# Patient Record
Sex: Male | Born: 1965 | Race: White | Hispanic: No | Marital: Single | Smoking: Current every day smoker
Health system: Southern US, Community
[De-identification: ages and names within clinical notes are randomized; demographics above are authoritative.]

## PROBLEM LIST (undated history)

## (undated) DIAGNOSIS — I10 Essential (primary) hypertension: Secondary | ICD-10-CM

## (undated) DIAGNOSIS — F329 Major depressive disorder, single episode, unspecified: Secondary | ICD-10-CM

## (undated) DIAGNOSIS — N289 Disorder of kidney and ureter, unspecified: Secondary | ICD-10-CM

## (undated) DIAGNOSIS — F32A Depression, unspecified: Secondary | ICD-10-CM

---

## 2017-01-04 ENCOUNTER — Emergency Department (HOSPITAL_COMMUNITY)
Admission: EM | Admit: 2017-01-04 | Discharge: 2017-01-04 | Disposition: A | Discharge status: Home / Self Care | Payer: Self-pay | Attending: Emergency Medicine | Admitting: Emergency Medicine

## 2017-01-04 ENCOUNTER — Other Ambulatory Visit: Payer: Self-pay

## 2017-01-04 ENCOUNTER — Encounter (HOSPITAL_COMMUNITY): Payer: Self-pay | Admitting: *Deleted

## 2017-01-04 DIAGNOSIS — Z5321 Procedure and treatment not carried out due to patient leaving prior to being seen by health care provider: Secondary | ICD-10-CM | POA: Insufficient documentation

## 2017-01-04 DIAGNOSIS — R443 Hallucinations, unspecified: Secondary | ICD-10-CM | POA: Insufficient documentation

## 2017-01-04 HISTORY — DX: Essential (primary) hypertension: I10

## 2017-01-04 HISTORY — DX: Depression, unspecified: F32.A

## 2017-01-04 HISTORY — DX: Major depressive disorder, single episode, unspecified: F32.9

## 2017-01-04 HISTORY — DX: Disorder of kidney and ureter, unspecified: N28.9

## 2017-01-04 NOTE — ED Notes (Signed)
Pt called for room with no response.  °

## 2017-01-04 NOTE — ED Notes (Signed)
Patient called for room with no response.  

## 2017-01-04 NOTE — ED Notes (Signed)
Pt called for room, no response from lobby 

## 2017-01-04 NOTE — ED Triage Notes (Addendum)
Pt has been under a lot of stress the last year, mother died, homeless, htn etc. Today started to feel worse and decided to come in. Pt keeps stating he needs to be admitted he feels so bad. Unable to define feeling bad.

## 2017-01-05 ENCOUNTER — Emergency Department (HOSPITAL_COMMUNITY): Payer: Self-pay

## 2017-01-05 ENCOUNTER — Emergency Department (HOSPITAL_COMMUNITY)
Admission: EM | Admit: 2017-01-05 | Discharge: 2017-01-06 | Disposition: A | Discharge status: Home / Self Care | Payer: Self-pay | Attending: Emergency Medicine | Admitting: Emergency Medicine

## 2017-01-05 ENCOUNTER — Other Ambulatory Visit: Payer: Self-pay

## 2017-01-05 ENCOUNTER — Encounter (HOSPITAL_COMMUNITY): Payer: Self-pay | Admitting: Emergency Medicine

## 2017-01-05 DIAGNOSIS — I10 Essential (primary) hypertension: Secondary | ICD-10-CM | POA: Insufficient documentation

## 2017-01-05 DIAGNOSIS — Z5900 Homelessness unspecified: Secondary | ICD-10-CM

## 2017-01-05 DIAGNOSIS — F17200 Nicotine dependence, unspecified, uncomplicated: Secondary | ICD-10-CM | POA: Insufficient documentation

## 2017-01-05 DIAGNOSIS — F29 Unspecified psychosis not due to a substance or known physiological condition: Secondary | ICD-10-CM | POA: Insufficient documentation

## 2017-01-05 DIAGNOSIS — R51 Headache: Secondary | ICD-10-CM | POA: Insufficient documentation

## 2017-01-05 DIAGNOSIS — E860 Dehydration: Secondary | ICD-10-CM | POA: Insufficient documentation

## 2017-01-05 LAB — COMPREHENSIVE METABOLIC PANEL
ALBUMIN: 4.3 g/dL (ref 3.5–5.0)
ALK PHOS: 60 U/L (ref 38–126)
ALK PHOS: 48 U/L (ref 38–126)
ALT: 48 U/L (ref 17–63)
ALT: 38 U/L (ref 17–63)
ANION GAP: 12 (ref 5–15)
ANION GAP: 8 (ref 5–15)
AST: 129 U/L — ABNORMAL HIGH (ref 15–41)
AST: 99 U/L — ABNORMAL HIGH (ref 15–41)
Albumin: 3.5 g/dL (ref 3.5–5.0)
BILIRUBIN TOTAL: 3.1 mg/dL — AB (ref 0.3–1.2)
BILIRUBIN TOTAL: 2.3 mg/dL — AB (ref 0.3–1.2)
BUN: 28 mg/dL — ABNORMAL HIGH (ref 6–20)
BUN: 24 mg/dL — ABNORMAL HIGH (ref 6–20)
CALCIUM: 9 mg/dL (ref 8.9–10.3)
CALCIUM: 8.3 mg/dL — AB (ref 8.9–10.3)
CO2: 22 mmol/L (ref 22–32)
CO2: 24 mmol/L (ref 22–32)
Chloride: 93 mmol/L — ABNORMAL LOW (ref 101–111)
Chloride: 100 mmol/L — ABNORMAL LOW (ref 101–111)
Creatinine, Ser: 1.37 mg/dL — ABNORMAL HIGH (ref 0.61–1.24)
Creatinine, Ser: 1.3 mg/dL — ABNORMAL HIGH (ref 0.61–1.24)
GFR calc non Af Amer: 58 mL/min — ABNORMAL LOW (ref >60)
GFR calc non Af Amer: >60 mL/min (ref >60)
GLUCOSE: 167 mg/dL — AB (ref 65–99)
Glucose, Bld: 87 mg/dL (ref 65–99)
POTASSIUM: 5.3 mmol/L — AB (ref 3.5–5.1)
POTASSIUM: 4.2 mmol/L (ref 3.5–5.1)
SODIUM: 132 mmol/L — AB (ref 135–145)
Sodium: 127 mmol/L — ABNORMAL LOW (ref 135–145)
TOTAL PROTEIN: 7.6 g/dL (ref 6.5–8.1)
TOTAL PROTEIN: 6.2 g/dL — AB (ref 6.5–8.1)

## 2017-01-05 LAB — URINALYSIS, ROUTINE W REFLEX MICROSCOPIC
BILIRUBIN URINE: NEGATIVE
Bacteria, UA: NONE SEEN
GLUCOSE, UA: NEGATIVE mg/dL
KETONES UR: NEGATIVE mg/dL
LEUKOCYTES UA: NEGATIVE
NITRITE: NEGATIVE
PH: 6 (ref 5.0–8.0)
Protein, ur: NEGATIVE mg/dL
Specific Gravity, Urine: 1.011 (ref 1.005–1.030)
Squamous Epithelial / LPF: NONE SEEN
WBC, UA: NONE SEEN WBC/hpf (ref 0–5)

## 2017-01-05 LAB — RAPID URINE DRUG SCREEN, HOSP PERFORMED
AMPHETAMINES: NOT DETECTED
BENZODIAZEPINES: NOT DETECTED
Barbiturates: NOT DETECTED
COCAINE: NOT DETECTED
OPIATES: NOT DETECTED
TETRAHYDROCANNABINOL: NOT DETECTED

## 2017-01-05 LAB — CBC
HCT: 45 % (ref 39.0–52.0)
HCT: 38.4 % — ABNORMAL LOW (ref 39.0–52.0)
HEMOGLOBIN: 15.8 g/dL (ref 13.0–17.0)
Hemoglobin: 13.5 g/dL (ref 13.0–17.0)
MCH: 34.1 pg — ABNORMAL HIGH (ref 26.0–34.0)
MCH: 34 pg (ref 26.0–34.0)
MCHC: 35.1 g/dL (ref 30.0–36.0)
MCHC: 35.2 g/dL (ref 30.0–36.0)
MCV: 97.2 fL (ref 78.0–100.0)
MCV: 96.7 fL (ref 78.0–100.0)
PLATELETS: 213 10*3/uL (ref 150–400)
PLATELETS: 175 10*3/uL (ref 150–400)
RBC: 4.63 MIL/uL (ref 4.22–5.81)
RBC: 3.97 MIL/uL — ABNORMAL LOW (ref 4.22–5.81)
RDW: 13.1 % (ref 11.5–15.5)
RDW: 13.1 % (ref 11.5–15.5)
WBC: 18.6 10*3/uL — AB (ref 4.0–10.5)
WBC: 12.2 10*3/uL — ABNORMAL HIGH (ref 4.0–10.5)

## 2017-01-05 LAB — BASIC METABOLIC PANEL
Anion gap: 5 (ref 5–15)
BUN: 23 mg/dL — AB (ref 6–20)
CALCIUM: 8 mg/dL — AB (ref 8.9–10.3)
CO2: 25 mmol/L (ref 22–32)
CREATININE: 1.26 mg/dL — AB (ref 0.61–1.24)
Chloride: 102 mmol/L (ref 101–111)
GFR calc Af Amer: >60 mL/min (ref >60)
GFR calc non Af Amer: >60 mL/min (ref >60)
GLUCOSE: 106 mg/dL — AB (ref 65–99)
Potassium: 3.7 mmol/L (ref 3.5–5.1)
Sodium: 132 mmol/L — ABNORMAL LOW (ref 135–145)

## 2017-01-05 LAB — CBC WITH DIFFERENTIAL/PLATELET
BASOS PCT: 0 %
Basophils Absolute: 0 10*3/uL (ref 0.0–0.1)
EOS ABS: 0.1 10*3/uL (ref 0.0–0.7)
EOS PCT: 0 %
HCT: 40.8 % (ref 39.0–52.0)
HEMOGLOBIN: 14.4 g/dL (ref 13.0–17.0)
LYMPHS ABS: 2.7 10*3/uL (ref 0.7–4.0)
Lymphocytes Relative: 18 %
MCH: 33.8 pg (ref 26.0–34.0)
MCHC: 35.3 g/dL (ref 30.0–36.0)
MCV: 95.8 fL (ref 78.0–100.0)
MONO ABS: 1.6 10*3/uL — AB (ref 0.1–1.0)
MONOS PCT: 11 %
NEUTROS PCT: 71 %
Neutro Abs: 10.6 10*3/uL — ABNORMAL HIGH (ref 1.7–7.7)
Platelets: 186 10*3/uL (ref 150–400)
RBC: 4.26 MIL/uL (ref 4.22–5.81)
RDW: 13.1 % (ref 11.5–15.5)
WBC: 14.9 10*3/uL — ABNORMAL HIGH (ref 4.0–10.5)

## 2017-01-05 LAB — INFLUENZA PANEL BY PCR (TYPE A & B)
INFLAPCR: NEGATIVE
Influenza B By PCR: NEGATIVE

## 2017-01-05 LAB — I-STAT CG4 LACTIC ACID, ED: Lactic Acid, Venous: 0.75 mmol/L (ref 0.5–1.9)

## 2017-01-05 LAB — AMMONIA: Ammonia: 39 umol/L — ABNORMAL HIGH (ref 9–35)

## 2017-01-05 LAB — ETHANOL

## 2017-01-05 MED ORDER — ZIPRASIDONE MESYLATE 20 MG IM SOLR: 20.0000 mg | Freq: Two times a day (BID) | INTRAMUSCULAR | Status: DC | PRN

## 2017-01-05 MED ORDER — ZOLPIDEM TARTRATE 5 MG PO TABS: 5.0000 mg | ORAL_TABLET | Freq: Every evening | ORAL | Status: DC | PRN

## 2017-01-05 MED ORDER — ALUM & MAG HYDROXIDE-SIMETH 200-200-20 MG/5ML PO SUSP
30.0000 mL | Freq: Four times a day (QID) | ORAL | Status: DC | PRN
Start: 2017-01-05 — End: 2017-01-06

## 2017-01-05 MED ORDER — SODIUM CHLORIDE 0.9 % IV BOLUS (SEPSIS)
1000.0000 mL | Freq: Once | INTRAVENOUS | Status: AC
  Administered 2017-01-05: 1000 mL via INTRAVENOUS

## 2017-01-05 MED ORDER — NICOTINE 21 MG/24HR TD PT24
21.0000 mg | MEDICATED_PATCH | Freq: Every day | TRANSDERMAL | Status: DC
  Administered 2017-01-05: 21 mg via TRANSDERMAL
  Filled 2017-01-05: qty 1

## 2017-01-05 MED ORDER — ACETAMINOPHEN 325 MG PO TABS
650.0000 mg | ORAL_TABLET | ORAL | Status: DC | PRN
  Administered 2017-01-05: 650 mg via ORAL
  Filled 2017-01-05: qty 2

## 2017-01-05 MED ORDER — LORAZEPAM 2 MG/ML IJ SOLN
1.0000 mg | Freq: Once | INTRAMUSCULAR | Status: AC
  Administered 2017-01-05: 1 mg via INTRAVENOUS
  Filled 2017-01-05: qty 1

## 2017-01-05 MED ORDER — LORAZEPAM 1 MG PO TABS
1.0000 mg | ORAL_TABLET | Freq: Once | ORAL | Status: AC
  Administered 2017-01-05: 1 mg via ORAL
  Filled 2017-01-05: qty 1

## 2017-01-05 MED ORDER — SODIUM CHLORIDE 0.9 % IV SOLN
1000.0000 mL | INTRAVENOUS | Status: DC
  Administered 2017-01-05: 1000 mL via INTRAVENOUS

## 2017-01-05 MED ORDER — LORAZEPAM 1 MG PO TABS: 1.0000 mg | ORAL_TABLET | ORAL | Status: DC | PRN

## 2017-01-05 NOTE — ED Triage Notes (Signed)
Pt c/o headache and feeling like "lasers are running through my head". Pt confused, difficulty following instructions, pt states noises are bothering him, pt with visual hallucinations. Pt denies SI.

## 2017-01-05 NOTE — ED Notes (Addendum)
With assessment pt verbalizes splitting headache, off balance, and hearing voices for a week. Pt alert to self, situation, and place not date/year. Pt denies drug or alcohol use. Effie ShyWentz MD aware of such findings; additional orders placed per verbal.

## 2017-01-05 NOTE — ED Notes (Signed)
Pt actively speaking to staff about the devil, the Lord's five crowns, and being stuck to the bed by magnets. Pt did not speak or present as such with previous assessment. Pt change in behavior post MD assessment.
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
## 2017-01-05 NOTE — ED Provider Notes (Addendum)
[facility]-EMERGENCY DEPT Provider Note   CSN: [id number] Arrival date & time: 01/05/17  1239     History   Chief Complaint Chief Complaint  Patient presents with  . hallucinations  . Headache    HPI Tanner Li is a 51 y.o. male.  Patient is here, complaining of "splitting headache" which gets worse when he gets into a bathtub.  He is homeless and does not have a bathtub.  He reports that he was admitted [facility] recently, for evaluation and treatment of a headache.  [place]  He denies use of illegal drugs.  He denies recent illnesses including fever, cough, vomiting, weakness or dizziness.  There are no other known modifying factors.  HPI  Past Medical History:  Diagnosis Date  . Depression   . Hypertension   . Renal disorder     There are no active problems to display for this patient.   History reviewed. No pertinent surgical history.     Home Medications    Prior to Admission medications   Not on File    Family History History reviewed. No pertinent family history.  Social History Social History   Tobacco Use  . Smoking status: Current Every Day Smoker  . Smokeless tobacco: Never Used  Substance Use Topics  . Alcohol use: Yes  . Drug use: No     Allergies   Penicillins   Review of Systems Review of Systems  All other systems reviewed and are negative.    Physical Exam Updated Vital Signs BP 135/81 (BP Location: Right Arm)   Pulse (!) 103   Temp 98.7 F (37.1 C) (Oral)   Resp 20   SpO2 99%   Physical Exam  Constitutional: He is oriented to person, place, and time. He appears well-developed and well-nourished.  HENT:  Head: Normocephalic and atraumatic.  Right Ear: External ear normal.  Left Ear: External ear normal.  Eyes: Conjunctivae and EOM are normal. Pupils are equal, round, and reactive to light.  Neck: Normal range of motion and phonation  normal. Neck supple.  Cardiovascular: Normal rate, regular rhythm and normal heart sounds.  Pulmonary/Chest: Effort normal and breath sounds normal. He exhibits no bony tenderness.  Abdominal: Soft. There is no tenderness.  Musculoskeletal: Normal range of motion.  Neurological: He is alert and oriented to person, place, and time. No cranial nerve deficit or sensory deficit. He exhibits normal muscle tone. Coordination normal.  Skin: Skin is warm, dry and intact.  Psychiatric: Judgment and thought content normal. His mood appears anxious. He is agitated.  He is fairly cooperative, but appears to be responding to internal stimuli.  He holds his arms in peculiar positions, but will move them when asked to do that.  Nursing note and vitals reviewed.    ED Treatments / Results  Labs (all labs ordered are listed, but only abnormal results are displayed) Labs Reviewed  COMPREHENSIVE METABOLIC PANEL - Abnormal; Notable for the following components:      Result Value   Sodium 127 (*)    Potassium 5.3 (*)    Chloride 93 (*)    Glucose, Bld 167 (*)    BUN 28 (*)    Creatinine, Ser 1.37 (*)    AST 129 (*)    Total Bilirubin 3.1 (*)    GFR calc non Af Amer 58 (*)    All other components within normal limits  CBC - Abnormal; Notable for  the following components:   WBC 18.6 (*)    MCH 34.1 (*)    All other components within normal limits  ETHANOL  RAPID URINE DRUG SCREEN, HOSP PERFORMED    EKG  EKG Interpretation None       Radiology Ct Head Wo Contrast  Result Date: 01/05/2017 CLINICAL DATA:  Altered mental status.  No reported injury. EXAM: CT HEAD WITHOUT CONTRAST TECHNIQUE: Contiguous axial images were obtained from the base of the skull through the vertex without intravenous contrast. COMPARISON:  None. FINDINGS: Brain: No evidence of parenchymal hemorrhage or extra-axial fluid collection. No mass lesion, mass effect, or midline shift. No CT evidence of acute infarction.  Cerebral volume is age appropriate. No ventriculomegaly. Vascular: No acute abnormality. Skull: No evidence of calvarial fracture. Sinuses/Orbits: Mucoperiosteal thickening in the inferior right maxillary sinus, partially visualized. No fluid levels. Other:  The mastoid air cells are unopacified. IMPRESSION: No evidence of acute intracranial abnormality. Minimal chronic appearing right maxillary sinusitis. Electronically Signed   By: Delbert Phenix M.D.   On: 01/05/2017 15:00    Procedures Procedures (including critical care time)  Medications Ordered in ED Medications  ziprasidone (GEODON) injection 20 mg (not administered)    And  LORazepam (ATIVAN) tablet 1 mg (not administered)  acetaminophen (TYLENOL) tablet 650 mg (not administered)  zolpidem (AMBIEN) tablet 5 mg (not administered)  alum & mag hydroxide-simeth (MAALOX/MYLANTA) 200-200-20 MG/5ML suspension 30 mL (not administered)  nicotine (NICODERM CQ - dosed in mg/24 hours) patch 21 mg (not administered)  sodium chloride 0.9 % bolus 1,000 mL (not administered)  sodium chloride 0.9 % bolus 1,000 mL (0 mLs Intravenous Stopped 01/05/17 1502)     Initial Impression / Assessment and Plan / ED Course  I have reviewed the triage vital signs and the nursing notes.  Pertinent labs & imaging results that were available during my care of the patient were reviewed by me and considered in my medical decision making (see chart for details).  Clinical Course as of Jan 05 1601  Mon Jan 05, 2017  1557 Mildly low Sodium: (!) 127 [EW]  1557 Mild elevation Potassium: (!) 5.3 [EW]  1558 Mildly low Chloride: (!) 93 [EW]  1558 high Glucose: (!) 167 [EW]  1558 High, prerenal BUN: (!) 28 [EW]  1558 high Creatinine: (!) 1.37 [EW]  1558 high Total Bilirubin: (!) 3.1 [EW]  1559 normal Alcohol, Ethyl (B): <10 [EW]  1559 high WBC: (!) 18.6 [EW]  1559 normal Temp: 98.7 F (37.1 C) [EW]  1601 Patient has multiple laboratory findings which are mildly  abnormal.  I suspect that they will improve with hydration and are related to him being homeless, and having poor oral intake.  Patient is nontoxic, from a medical perspective on arrival.  He does not have a fever.  Patient treated with 2 L normal saline in the ED and will ask labs, and 4 hours.  In the meantime he will be offered oral nutrition.  [EW]    Clinical Course User Index [EW] Mancel Bale, MD     Patient Vitals for the past 24 hrs:  BP Temp Temp src Pulse Resp SpO2  01/05/17 1530 135/81 98.7 F (37.1 C) Oral (!) 103 20 99 %  01/05/17 1304 (!) 198/101 98.1 F (36.7 C) Oral (!) 119 20 98 %    TTS consultation   Final Clinical Impressions(s) / ED Diagnoses   Final diagnoses:  Psychosis, unspecified psychosis type (HCC)  Homelessness    Psychosis, unclear  duration, with homelessness.  Patient states he was [facility] however medical records obtained from them, only showed emergency department visit in January 2016.  At that time he was evaluated for hematuria.  Patient requires evaluation and probably placement by psychiatry services.  Nursing Notes Reviewed/ Care Coordinated Applicable Imaging Reviewed Interpretation of Laboratory Data incorporated into ED treatment   Plan-TTS evaluation.  Disposition per oncoming provider team.  ED Discharge Orders    None          Mancel BaleWentz, Nasteho Glantz, MD 01/05/17 [phone]

## 2017-01-05 NOTE — ED Provider Notes (Signed)
Accepted patient signout for reevaluation of CBC and basic chemistry panel.  Patient presented with signs of psychosis with hallucinations and homelessness.  His initial chemistry showed him to be hyponatremic with leukocytosis.  Dr, Effie ShyWentz initiated rehydration suspicion for lab derangement secondary to dehydration and not eating in homeless condition.  CBC has improved but patient still has leukocytosis of 14,000.  Sodium has improved to 132 from 127.  I have reassessed the patient.  He is alert but responding to external stimuli.  He initially explained to me he thought that the IV in his forearm had caused a large pipe like thing to pass through his chest.  Once I explained to him that the IV unequivocally stopped in his forearm, he accepted this.  He however went on to describe that a lot of things have just seemed "out of order or crossed up in him".  It is difficult to determine his alcohol use pattern.  Reports that he has been in a lot of bars over the year and probably taken drinks out of other people's beers but he does nothing to specify his own drinking usage.  He began to drift off into mumbling speech while gazing out the window appearing to see something in the hallway.  At this time, will administer Ativan due to inconclusiveness of alcohol use and possible withdrawal.  Will administer additional fluids and obtain chest x-ray and some additional diagnostic labs.  We will continue to monitor to determine if patient is medically cleared for psychiatric admission.   23: 54 I have rechecked the patient and continue medical management.  At this time he remains alert in no distress.  He continues to have features of schizophrenic appearing psychosis but is nontoxic and continues to appear physically well.  After hydration labs have been monitored and white blood cell count continues to decrease.  I have not identified any source is suspicious for infection.  Patient's sodium has been correcting  over the course of the day.  At this point, with continued regular diet and as needed management for psychiatric illness I feel the patient is stable and medically cleared for definitive psychiatric placement.   Arby BarrettePfeiffer, Kester Stimpson, MD 01/05/17 [phone]

## 2017-01-05 NOTE — ED Notes (Signed)
TTS at bedside. 

## 2017-01-06 ENCOUNTER — Encounter (HOSPITAL_COMMUNITY): Payer: Self-pay | Admitting: Emergency Medicine

## 2017-01-06 ENCOUNTER — Emergency Department (HOSPITAL_COMMUNITY)
Admission: EM | Admit: 2017-01-06 | Discharge: 2017-01-06 | Disposition: A | Discharge status: Home / Self Care | Payer: Self-pay | Attending: Emergency Medicine | Admitting: Emergency Medicine

## 2017-01-06 DIAGNOSIS — R51 Headache: Secondary | ICD-10-CM | POA: Insufficient documentation

## 2017-01-06 DIAGNOSIS — F29 Unspecified psychosis not due to a substance or known physiological condition: Secondary | ICD-10-CM | POA: Diagnosis present

## 2017-01-06 DIAGNOSIS — Z5900 Homelessness unspecified: Secondary | ICD-10-CM | POA: Insufficient documentation

## 2017-01-06 DIAGNOSIS — E860 Dehydration: Secondary | ICD-10-CM | POA: Insufficient documentation

## 2017-01-06 DIAGNOSIS — Z5321 Procedure and treatment not carried out due to patient leaving prior to being seen by health care provider: Secondary | ICD-10-CM | POA: Insufficient documentation

## 2017-01-06 DIAGNOSIS — R4587 Impulsiveness: Secondary | ICD-10-CM

## 2017-01-06 DIAGNOSIS — F17210 Nicotine dependence, cigarettes, uncomplicated: Secondary | ICD-10-CM

## 2017-01-06 NOTE — ED Triage Notes (Signed)
Patient c/o intermittent headache x2 weeks. Patient also reports difficulty sleeping x3 days. D/c this morning. Ambulatory.

## 2017-01-06 NOTE — [facility] Suicide Risk Assessment (Signed)
Suicide Risk Assessment  Discharge Assessment   [facility]   Principal Problem: Psychosis [facility](HCC) Discharge Diagnoses:  Patient Active Problem List   Diagnosis Date Noted  . Dehydration [E86.0]   . Homelessness [Z59.0]   . Psychosis (HCC) [F29]     Total Time spent with patient: 45 minutes  Musculoskeletal: Strength & Muscle Tone: within normal limits Gait & Station: normal Patient leans: N/A  Psychiatric Specialty Exam: Physical Exam  Constitutional: He is oriented to person, place, and time. He appears well-developed and well-nourished.  HENT:  Head: Normocephalic.  Respiratory: Effort normal.  Musculoskeletal: Normal range of motion.  Neurological: He is alert and oriented to person, place, and time.  Psychiatric: His speech is normal and behavior is normal. Thought content normal. Cognition and memory are normal. He expresses impulsivity. He exhibits a depressed mood.   Review of Systems  Psychiatric/Behavioral: Positive for depression. Negative for hallucinations, memory loss, substance abuse and suicidal ideas. The patient is not nervous/anxious and does not have insomnia.   All other systems reviewed and are negative.  Blood pressure 133/76, pulse 94, temperature 98.3 F (36.8 C), temperature source Oral, resp. rate 20, SpO2 99 %.There is no height or weight on file to calculate BMI. General Appearance: Disheveled Eye Contact:  Fair Speech:  Clear and Coherent Volume:  Normal Mood:  Depressed Affect:  Congruent and Depressed Thought Process:  Coherent, Goal Directed and Linear Orientation:  Full (Time, Place, and Person) Thought Content:  Logical Suicidal Thoughts:  No Homicidal Thoughts:  No Memory:  Immediate;   Good Recent;   Fair Remote;   Fair Judgement:  Fair Insight:  Fair Psychomotor Activity:  Normal Concentration:  Concentration: Good and Attention Span: Good Recall:  Good Fund of Knowledge:  Good Language:   Good Akathisia:  No Handed:  Right AIMS (if indicated):    Assets:  ArchitectCommunication Skills Financial Resources/Insurance Housing Resilience ADL's:  Intact Cognition:  WNL   Mental Status Per Nursing Assessment::   On Admission:   Dehydration and delerious  Demographic Factors:  Male, Caucasian, Low socioeconomic status and Unemployed  Loss Factors: Financial problems/change in socioeconomic status  Historical Factors: Impulsivity  Risk Reduction Factors:   Sense of responsibility to family  Continued Clinical Symptoms:  Depression:   Impulsivity  Cognitive Features That Contribute To Risk:  Closed-mindedness    Suicide Risk:  Minimal: No identifiable suicidal ideation.  Patients presenting with no risk factors but with morbid ruminations; may be classified as minimal risk based on the severity of the depressive symptoms    Plan Of Care/Follow-up recommendations:  Activity:  as tolerated Diet:  Heart Healthy  Laveda AbbeLaurie Britton Parks, NP 01/06/2017, 11:18 AM

## 2017-01-06 NOTE — ED Notes (Signed)
SBAR Report received from previous nurse. Pt received calm and visible on unit. Pt gave no meaningful answers to assessment questions related to current SI/ HI, A/V H, depression, anxiety, or pain at this time, and appears otherwise stable and free of distress. Pt reminded of camera surveillance, q 15 min rounds, and rules of the milieu. Will continue to assess. 

## 2017-01-06 NOTE — ED Notes (Signed)
Pt discharged home. Pt was not open to learning and did not benefit from discharge instructions. He said that he just needs to be here for a few days to sleep. 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 His cousin came to visit and pt would not sign consent to release information to his cousin. He would not sign the discharge because he could not see what he was signing. Pt's cousin left with him and said, "My day is going to be terrible."  All belongings returned to pt who initialed "x" for same. Denies SI/HI. Not delusional and not responding to internal stimuli. Escorted pt to the ED exit.

## 2017-01-06 NOTE — ED Notes (Signed)
Last call for room. Not in lobby.

## 2017-01-06 NOTE — ED Notes (Signed)
[facility] Expected date:  Expected time:  Means of arrival:  Comments: Johnsen

## 2017-01-06 NOTE — ED Notes (Signed)
Pt called for room. Not in lobby.  

## 2017-01-06 NOTE — Consult Note (Signed)
[place] [facility] Consult   Reason for Consult:  Dehydration and delerium Referring Physician:  EDP Patient Identification: Tanner Li MRN:  [id number] Principal Diagnosis: Psychosis [facility]) Diagnosis:   Patient Active Problem List   Diagnosis Date Noted  . Dehydration [E86.0]   . Homelessness [Z59.0]   . Psychosis ([place]) [F29]     Total Time spent with patient: 45 minutes  Subjective:   Tanner Li is a 52 y.o. male patient admitted with dehydration with delerium.  HPI:  Pt was seen and chart reviewed with treatment team and Dr Mariea Clonts. Pt stated the reason he came to the hospital was for a headache and to get some sleep.  Pt denies suicidal/homicidal ideation, denies auditory/visual hallucinations and does not appear to be responding to internal stimuli. 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 Pt stated he had not been sleeping well and just wants to rest. Pt denies any family history of mental illness. Pt stated he does not take any medications for mental health but he is depressed sometimes. Pt is not interested in seeing any outpatient providers for his depression. Pt is stable and psychiatrically clear for discharge.   Past Psychiatric History: As above  Risk to Self: None Risk to Others: None Prior Inpatient Therapy: Prior Inpatient Therapy: (unable to assess) Prior Outpatient Therapy: Prior Outpatient Therapy: (unable to assess) Does patient have an ACCT team?: Unknown Does patient have Intensive In-House Services?  : No Does patient have [facility] services? : Unknown Does patient have P4[facility] services?: Unknown  Past Medical History:  Past Medical History:  Diagnosis Date  . Depression   . Hypertension   . Renal disorder    History reviewed. No pertinent surgical history. Family History: History reviewed. No pertinent family history. Family Psychiatric  History: Unknown Social History:  Social History   Substance and  Sexual Activity  Alcohol Use Yes     Social History   Substance and Sexual Activity  Drug Use No    Social History   Socioeconomic History  . Marital status: Single    Spouse name: None  . Number of children: None  . Years of education: None  . Highest education level: None  Social Needs  . Financial resource strain: None  . Food insecurity - worry: None  . Food insecurity - inability: None  . Transportation needs - medical: None  . Transportation needs - non-medical: None  Occupational History  . None  Tobacco Use  . Smoking status: Current Every Day Smoker  . Smokeless tobacco: Never Used  Substance and Sexual Activity  . Alcohol use: Yes  . Drug use: No  . Sexual activity: None  Other Topics Concern  . None  Social History Narrative  . None   Additional Social History:    Allergies:   Allergies  Allergen Reactions  . Penicillins Swelling and Other (See Comments)    Has patient had a PCN reaction causing immediate rash, facial/tongue/throat swelling, SOB or lightheadedness with hypotension: no Has patient had a PCN reaction causing severe rash involving mucus membranes or skin necrosis: no Has patient had a PCN reaction that required hospitalization: no Has patient had a PCN reaction occurring within the last 10 years: no If all of the above answers are "NO", then may proceed with Cephalosporin use.     Labs:  Results for orders placed or performed during the hospital encounter of 01/05/17 (from the past 48 hour(s))  Comprehensive  metabolic panel     Status: Abnormal   Collection Time: 01/05/17  1:24 PM  Result Value Ref Range   Sodium 127 (L) 135 - 145 mmol/L   Potassium 5.3 (H) 3.5 - 5.1 mmol/L   Chloride 93 (L) 101 - 111 mmol/L   CO2 22 22 - 32 mmol/L   Glucose, Bld 167 (H) 65 - 99 mg/dL   BUN 28 (H) 6 - 20 mg/dL   Creatinine, Ser 1.37 (H) 0.61 - 1.24 mg/dL   Calcium 9.0 8.9 - 10.3 mg/dL   Total Protein 7.6 6.5 - 8.1 g/dL   Albumin 4.3 3.5 - 5.0  g/dL   AST 129 (H) 15 - 41 U/L   ALT 48 17 - 63 U/L   Alkaline Phosphatase 60 38 - 126 U/L   Total Bilirubin 3.1 (H) 0.3 - 1.2 mg/dL   GFR calc non Af Amer 58 (L) >60 mL/min   GFR calc Af Amer >60 >60 mL/min    Comment: (NOTE) The eGFR has been calculated using the CKD EPI equation. This calculation has not been validated in all clinical situations. eGFR's persistently <60 mL/min signify possible Chronic Kidney Disease.    Anion gap 12 5 - 15  Ethanol     Status: None   Collection Time: 01/05/17  1:24 PM  Result Value Ref Range   Alcohol, Ethyl (B) <10 <10 mg/dL    Comment:        LOWEST DETECTABLE LIMIT FOR SERUM ALCOHOL IS 10 mg/dL FOR MEDICAL PURPOSES ONLY   cbc     Status: Abnormal   Collection Time: 01/05/17  1:24 PM  Result Value Ref Range   WBC 18.6 (H) 4.0 - 10.5 K/uL   RBC 4.63 4.22 - 5.81 MIL/uL   Hemoglobin 15.8 13.0 - 17.0 g/dL   HCT 45.0 39.0 - 52.0 %   MCV 97.2 78.0 - 100.0 fL   MCH 34.1 (H) 26.0 - 34.0 pg   MCHC 35.1 30.0 - 36.0 g/dL   RDW 13.1 11.5 - 15.5 %   Platelets 213 150 - 400 K/uL  CBC with Differential     Status: Abnormal   Collection Time: 01/05/17  5:50 PM  Result Value Ref Range   WBC 14.9 (H) 4.0 - 10.5 K/uL   RBC 4.26 4.22 - 5.81 MIL/uL   Hemoglobin 14.4 13.0 - 17.0 g/dL   HCT 40.8 39.0 - 52.0 %   MCV 95.8 78.0 - 100.0 fL   MCH 33.8 26.0 - 34.0 pg   MCHC 35.3 30.0 - 36.0 g/dL   RDW 13.1 11.5 - 15.5 %   Platelets 186 150 - 400 K/uL   Neutrophils Relative % 71 %   Neutro Abs 10.6 (H) 1.7 - 7.7 K/uL   Lymphocytes Relative 18 %   Lymphs Abs 2.7 0.7 - 4.0 K/uL   Monocytes Relative 11 %   Monocytes Absolute 1.6 (H) 0.1 - 1.0 K/uL   Eosinophils Relative 0 %   Eosinophils Absolute 0.1 0.0 - 0.7 K/uL   Basophils Relative 0 %   Basophils Absolute 0.0 0.0 - 0.1 K/uL  Comprehensive metabolic panel     Status: Abnormal   Collection Time: 01/05/17  5:50 PM  Result Value Ref Range   Sodium 132 (L) 135 - 145 mmol/L   Potassium 4.2 3.5 - 5.1  mmol/L    Comment: DELTA CHECK NOTED REPEATED TO VERIFY    Chloride 100 (L) 101 - 111 mmol/L   CO2 24 22 -  32 mmol/L   Glucose, Bld 87 65 - 99 mg/dL   BUN 24 (H) 6 - 20 mg/dL   Creatinine, Ser 1.30 (H) 0.61 - 1.24 mg/dL   Calcium 8.3 (L) 8.9 - 10.3 mg/dL   Total Protein 6.2 (L) 6.5 - 8.1 g/dL   Albumin 3.5 3.5 - 5.0 g/dL   AST 99 (H) 15 - 41 U/L   ALT 38 17 - 63 U/L   Alkaline Phosphatase 48 38 - 126 U/L   Total Bilirubin 2.3 (H) 0.3 - 1.2 mg/dL   GFR calc non Af Amer >60 >60 mL/min   GFR calc Af Amer >60 >60 mL/min    Comment: (NOTE) The eGFR has been calculated using the CKD EPI equation. This calculation has not been validated in all clinical situations. eGFR's persistently <60 mL/min signify possible Chronic Kidney Disease.    Anion gap 8 5 - 15  Rapid urine drug screen (hospital performed)     Status: None   Collection Time: 01/05/17  7:47 PM  Result Value Ref Range   Opiates NONE DETECTED NONE DETECTED   Cocaine NONE DETECTED NONE DETECTED   Benzodiazepines NONE DETECTED NONE DETECTED   Amphetamines NONE DETECTED NONE DETECTED   Tetrahydrocannabinol NONE DETECTED NONE DETECTED   Barbiturates NONE DETECTED NONE DETECTED    Comment: (NOTE) DRUG SCREEN FOR MEDICAL PURPOSES ONLY.  IF CONFIRMATION IS NEEDED FOR ANY PURPOSE, NOTIFY LAB WITHIN 5 DAYS. LOWEST DETECTABLE LIMITS FOR URINE DRUG SCREEN Drug Class                     Cutoff (ng/mL) Amphetamine and metabolites    1000 Barbiturate and metabolites    200 Benzodiazepine                 811 Tricyclics and metabolites     300 Opiates and metabolites        300 Cocaine and metabolites        300 THC                            50   Urinalysis, Routine w reflex microscopic     Status: Abnormal   Collection Time: 01/05/17  7:47 PM  Result Value Ref Range   Color, Urine YELLOW YELLOW   APPearance CLEAR CLEAR   Specific Gravity, Urine 1.011 1.005 - 1.030   pH 6.0 5.0 - 8.0   Glucose, UA NEGATIVE NEGATIVE mg/dL    Hgb urine dipstick SMALL (A) NEGATIVE   Bilirubin Urine NEGATIVE NEGATIVE   Ketones, ur NEGATIVE NEGATIVE mg/dL   Protein, ur NEGATIVE NEGATIVE mg/dL   Nitrite NEGATIVE NEGATIVE   Leukocytes, UA NEGATIVE NEGATIVE   RBC / HPF 0-5 0 - 5 RBC/hpf   WBC, UA NONE SEEN 0 - 5 WBC/hpf   Bacteria, UA NONE SEEN NONE SEEN   Squamous Epithelial / LPF NONE SEEN NONE SEEN  Ammonia     Status: Abnormal   Collection Time: 01/05/17  8:16 PM  Result Value Ref Range   Ammonia 39 (H) 9 - 35 umol/L  Influenza panel by PCR (type A & B)     Status: None   Collection Time: 01/05/17  8:17 PM  Result Value Ref Range   Influenza A By PCR NEGATIVE NEGATIVE   Influenza B By PCR NEGATIVE NEGATIVE    Comment: (NOTE) The Xpert Xpress Flu assay is intended as an aid in the diagnosis of  influenza and should not be used as a sole basis for treatment.  This  assay is FDA approved for nasopharyngeal swab specimens only. Nasal  washings and aspirates are unacceptable for Xpert Xpress Flu testing.   I-Stat CG4 Lactic Acid, ED     Status: None   Collection Time: 01/05/17  9:33 PM  Result Value Ref Range   Lactic Acid, Venous 0.75 0.5 - 1.9 mmol/L  Basic metabolic panel     Status: Abnormal   Collection Time: 01/05/17 10:41 PM  Result Value Ref Range   Sodium 132 (L) 135 - 145 mmol/L   Potassium 3.7 3.5 - 5.1 mmol/L   Chloride 102 101 - 111 mmol/L   CO2 25 22 - 32 mmol/L   Glucose, Bld 106 (H) 65 - 99 mg/dL   BUN 23 (H) 6 - 20 mg/dL   Creatinine, Ser 1.26 (H) 0.61 - 1.24 mg/dL   Calcium 8.0 (L) 8.9 - 10.3 mg/dL   GFR calc non Af Amer >60 >60 mL/min   GFR calc Af Amer >60 >60 mL/min    Comment: (NOTE) The eGFR has been calculated using the CKD EPI equation. This calculation has not been validated in all clinical situations. eGFR's persistently <60 mL/min signify possible Chronic Kidney Disease.    Anion gap 5 5 - 15  CBC     Status: Abnormal   Collection Time: 01/05/17 10:41 PM  Result Value Ref Range    WBC 12.2 (H) 4.0 - 10.5 K/uL   RBC 3.97 (L) 4.22 - 5.81 MIL/uL   Hemoglobin 13.5 13.0 - 17.0 g/dL   HCT 38.4 (L) 39.0 - 52.0 %   MCV 96.7 78.0 - 100.0 fL   MCH 34.0 26.0 - 34.0 pg   MCHC 35.2 30.0 - 36.0 g/dL   RDW 13.1 11.5 - 15.5 %   Platelets 175 150 - 400 K/uL    Current Facility-Administered Medications  Medication Dose Route Frequency Provider Last Rate Last Dose  . 0.9 %  sodium chloride infusion  1,000 mL Intravenous Continuous Charlesetta Shanks, MD 125 mL/hr at 01/05/17 2154 1,000 mL at 01/05/17 2154  . acetaminophen (TYLENOL) tablet 650 mg  650 mg Oral Q4H PRN Daleen Bo, MD   650 mg at 01/05/17 2152  . alum & mag hydroxide-simeth (MAALOX/MYLANTA) 200-200-20 MG/5ML suspension 30 mL  30 mL Oral Q6H PRN Daleen Bo, MD      . ziprasidone (GEODON) injection 20 mg  20 mg Intramuscular Q12H PRN Daleen Bo, MD       And  . LORazepam (ATIVAN) tablet 1 mg  1 mg Oral PRN Daleen Bo, MD      . nicotine (NICODERM CQ - dosed in mg/24 hours) patch 21 mg  21 mg Transdermal Daily Daleen Bo, MD   21 mg at 01/05/17 2044  . zolpidem (AMBIEN) tablet 5 mg  5 mg Oral QHS PRN Daleen Bo, MD       No current outpatient medications on file.    Musculoskeletal: Strength & Muscle Tone: within normal limits Gait & Station: normal Patient leans: N/A  Psychiatric Specialty Exam: Physical Exam  Nursing note and vitals reviewed. Constitutional: He is oriented to person, place, and time. He appears well-developed and well-nourished.  HENT:  Head: Normocephalic and atraumatic.  Neck: Normal range of motion.  Respiratory: Effort normal.  Musculoskeletal: Normal range of motion.  Neurological: He is alert and oriented to person, place, and time.  Psychiatric: His speech is normal and behavior is normal.  Thought content normal. Cognition and memory are normal. He expresses impulsivity. He exhibits a depressed mood.    Review of Systems  Psychiatric/Behavioral: Positive for  depression. Negative for hallucinations, memory loss, substance abuse and suicidal ideas. The patient is not nervous/anxious and does not have insomnia.   All other systems reviewed and are negative.   Blood pressure 133/76, pulse 94, temperature 98.3 F (36.8 C), temperature source Oral, resp. rate 20, SpO2 99 %.There is no height or weight on file to calculate BMI.  General Appearance: Disheveled  Eye Contact:  Fair  Speech:  Clear and Coherent  Volume:  Normal  Mood:  Depressed  Affect:  Congruent and Depressed  Thought Process:  Coherent, Goal Directed and Linear  Orientation:  Full (Time, Place, and Person)  Thought Content:  Logical  Suicidal Thoughts:  No  Homicidal Thoughts:  No  Memory:  Immediate;   Good Recent;   Fair Remote;   Fair  Judgement:  Fair  Insight:  Fair  Psychomotor Activity:  Normal  Concentration:  Concentration: Good and Attention Span: Good  Recall:  Good  Fund of Knowledge:  Good  Language:  Good  Akathisia:  No  Handed:  Right  AIMS (if indicated):     Assets:  Agricultural consultant Housing Resilience  ADL's:  Intact  Cognition:  WNL  Sleep:        Treatment Plan Summary: Plan Delerium with dehydration Discharge Home Take all medications as prescribed Avoid the use of alcohol and illicit drugs Follow up with your outpatient PCP for any medical concerns or issues  Disposition: No evidence of imminent risk to self or others at present.   Patient does not meet criteria for psychiatric inpatient admission. Supportive therapy provided about ongoing stressors. Discussed crisis plan, support from social network, calling 911, coming to the Emergency Department, and calling Suicide Hotline.  Ethelene Hal, NP 01/06/2017 11:17 AM   Patient seen face-to-face for psychiatric evaluation, chart reviewed and case discussed with the physician extender and developed treatment plan. Reviewed the information documented  and agree with the treatment plan.  Buford Dresser, DO

## 2017-01-06 NOTE — [facility] Assessment (Signed)
[facility]  Per Juanetta BeetsJacqueline Norman, DO, this pt does not require psychiatric hospitalization at this time.  [facility]  No outpatient referral information is required.  Pt's nurse, Diane, has been notified.  Doylene Canninghomas Shirlyn Savin, MA Triage Specialist [phone]

## 2017-01-06 NOTE — ED Notes (Signed)
Pt called for room. No answer from lobby. 

## 2019-04-09 IMAGING — CR DG CHEST 2V
2 series · 2 of 2 positions shown · non-contrast
Comparison: None.

CLINICAL DATA: Headache and leukocytosis.

EXAM:
CHEST  2 VIEW

[w chest lat]
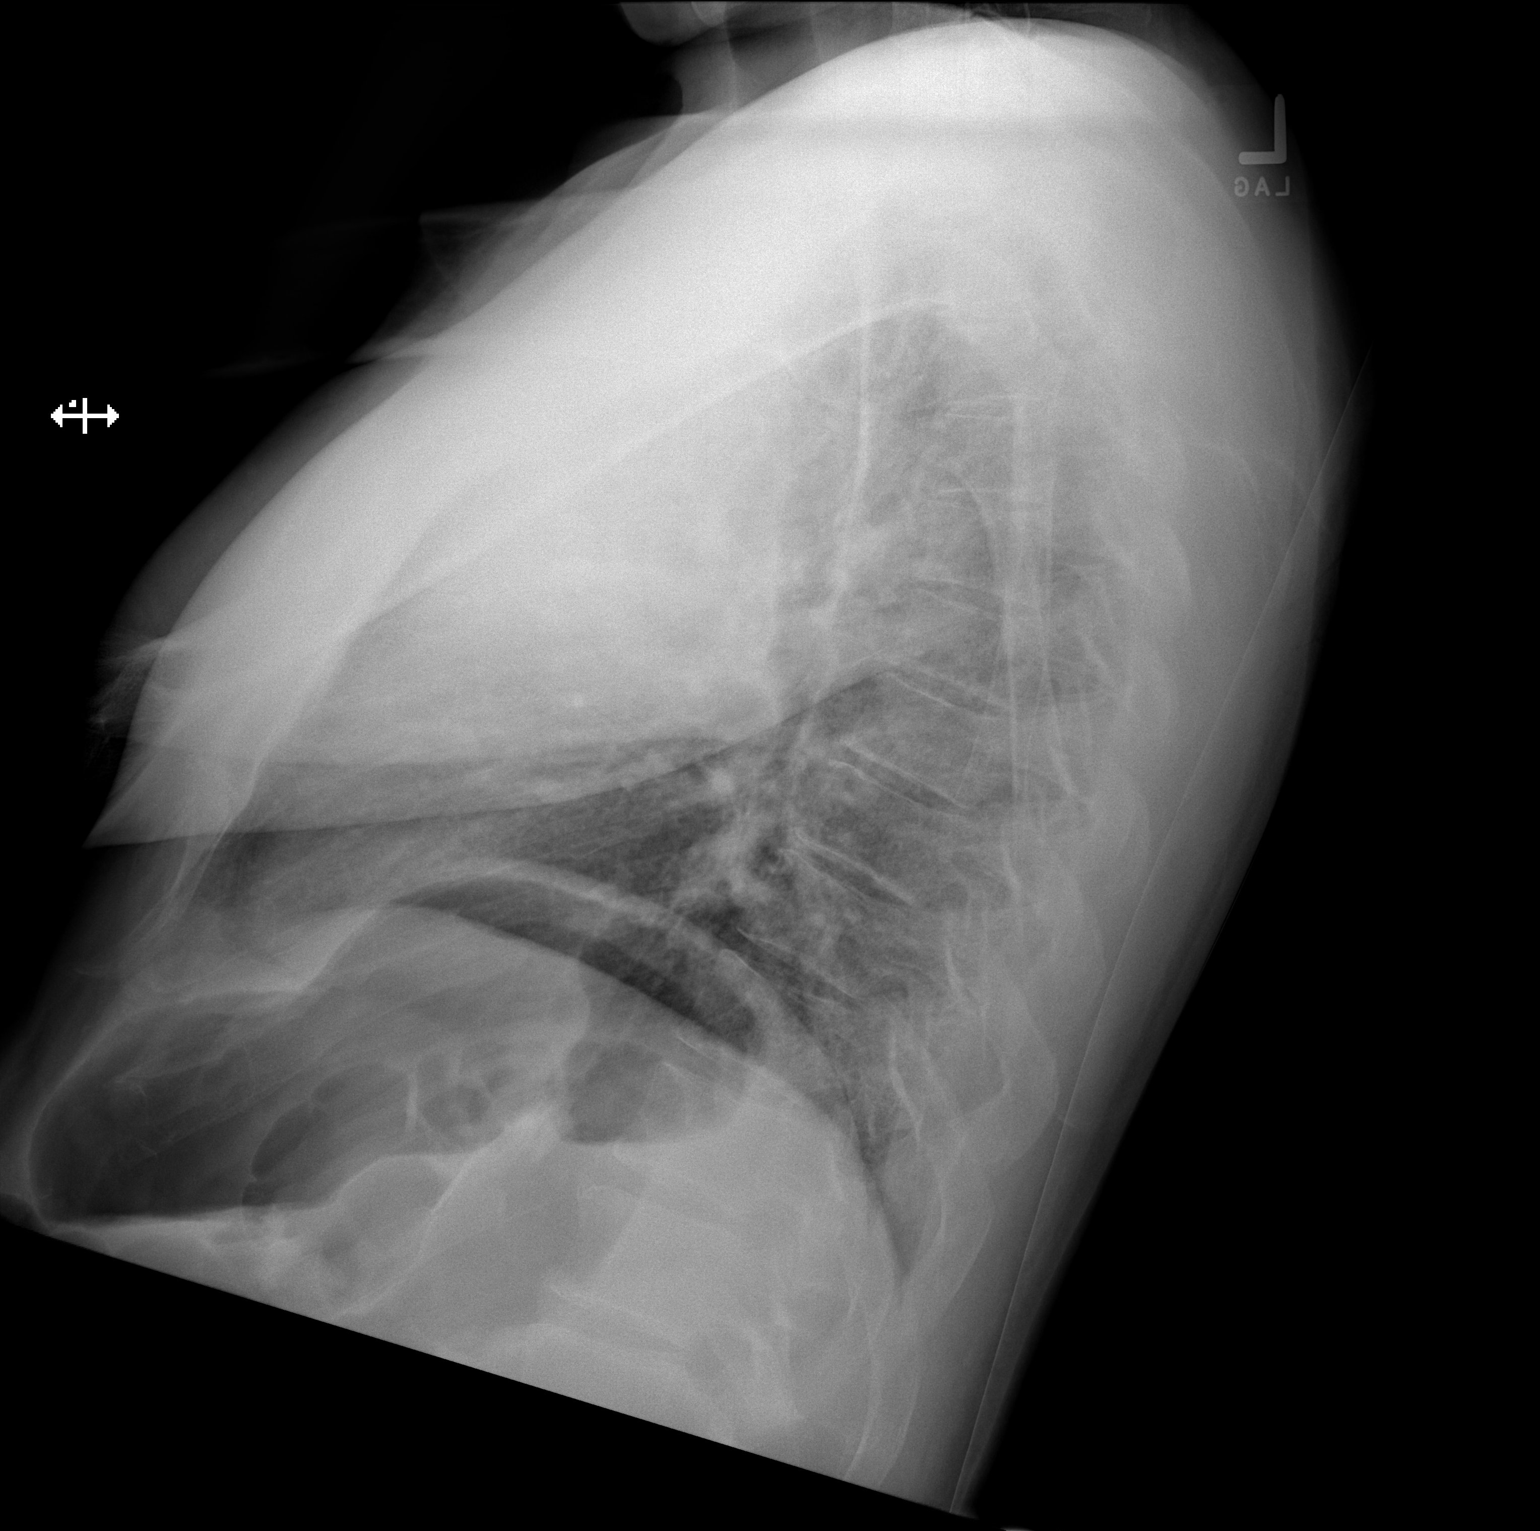

[x chest ap]
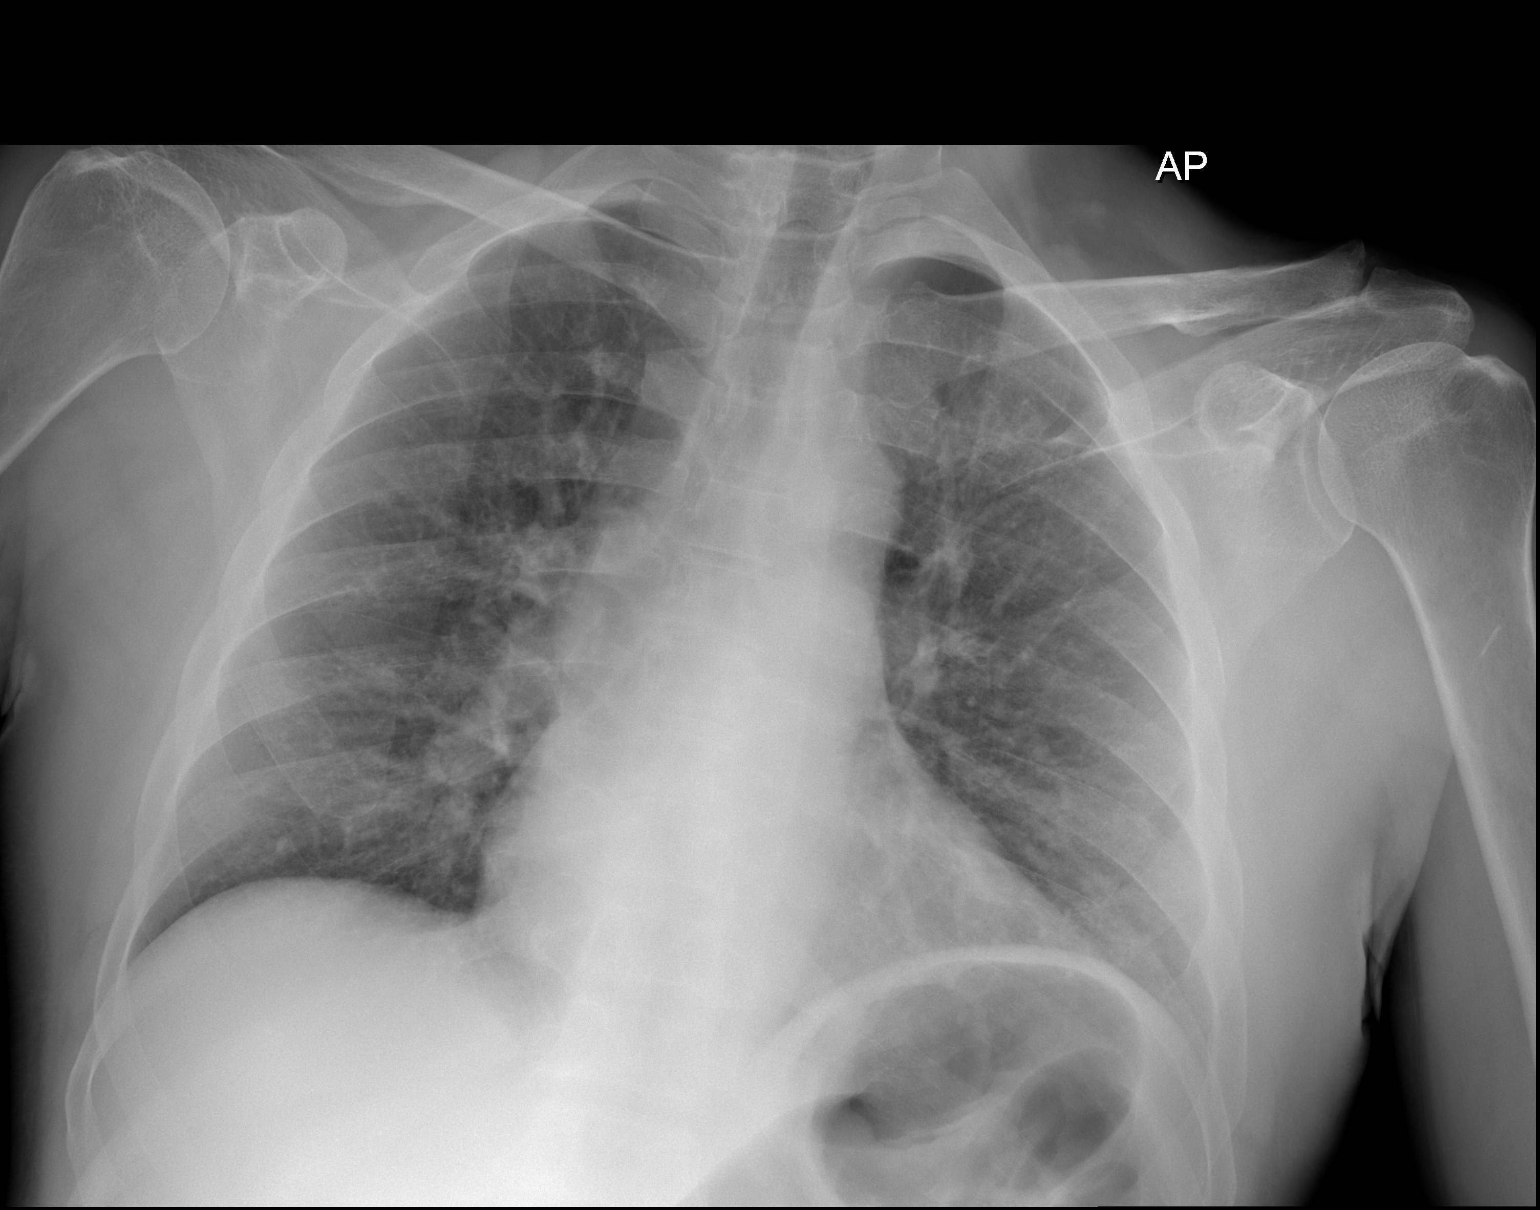

[2 of 2 positions shown; findings below may reference images not displayed]

FINDINGS: The heart size and mediastinal contours are within normal limits.
Probable small granuloma at the right lung base. No pneumonic
consolidation or CHF. The visualized skeletal structures are
unremarkable.
IMPRESSION: No active cardiopulmonary disease.

## 2019-04-09 IMAGING — CT CT HEAD W/O CM
3 series · 15 of 47 positions shown, 18 images · non-contrast
Comparison: None.

CLINICAL DATA: Altered mental status.  No reported injury.

EXAM:
CT HEAD WITHOUT CONTRAST
TECHNIQUE: Contiguous axial images were obtained from the base of the skull
through the vertex without intravenous contrast.

[Series 3: head wo · axial · 0.48mm/px · z∈[-91,+54]mm · 9 of 35 slices shown, 12 images]
[im 3/35  brain]
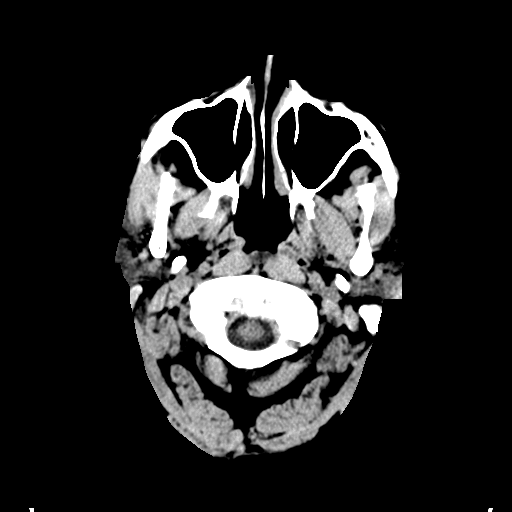
[im 3/35  bone]
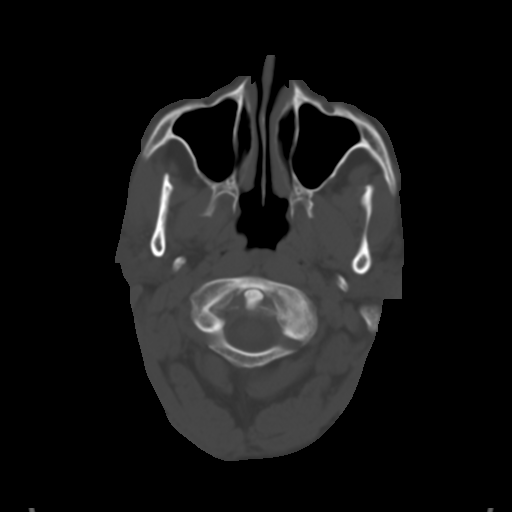
[im 6/35  brain]
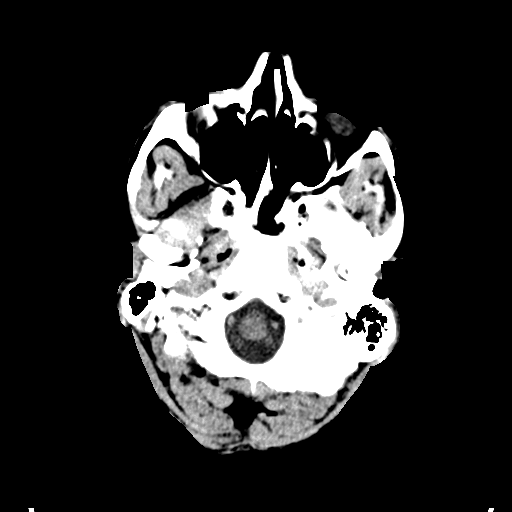
[im 10/35  brain]
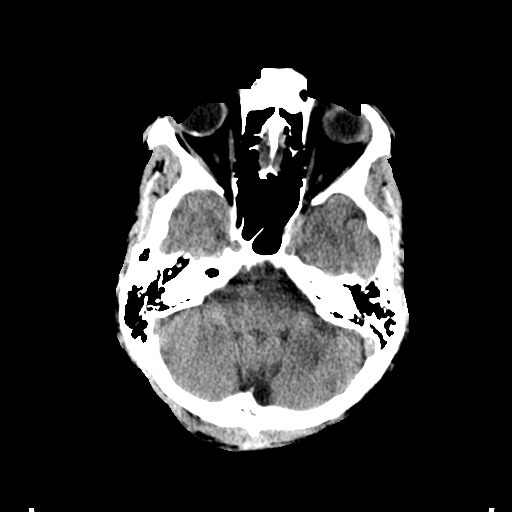
[im 13/35  brain]
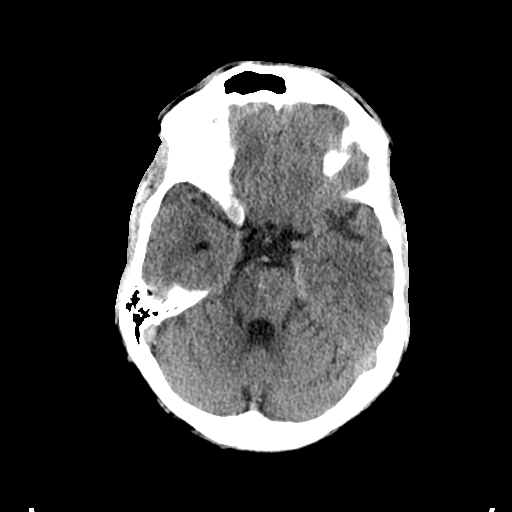
[im 18/35  brain]
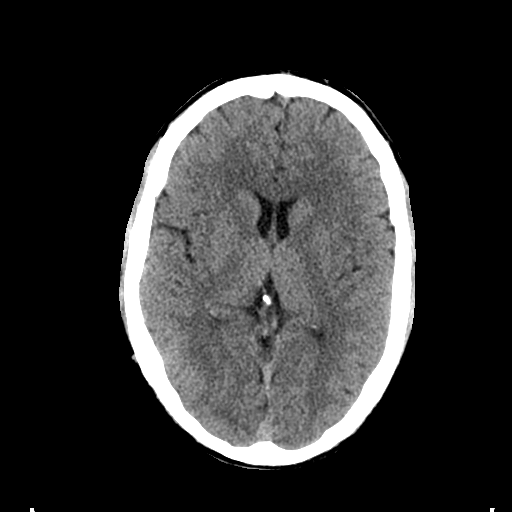
[im 18/35  bone]
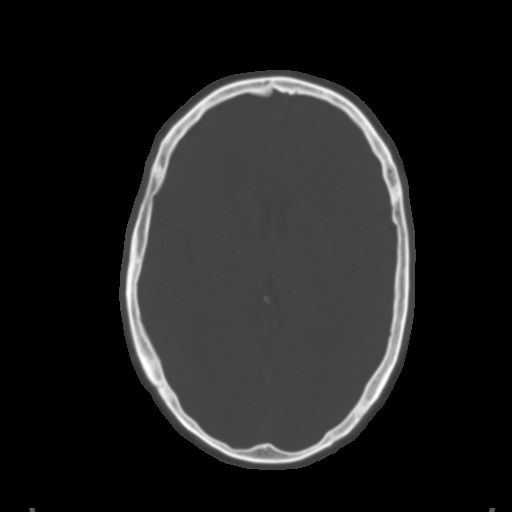
[im 22/35  brain]
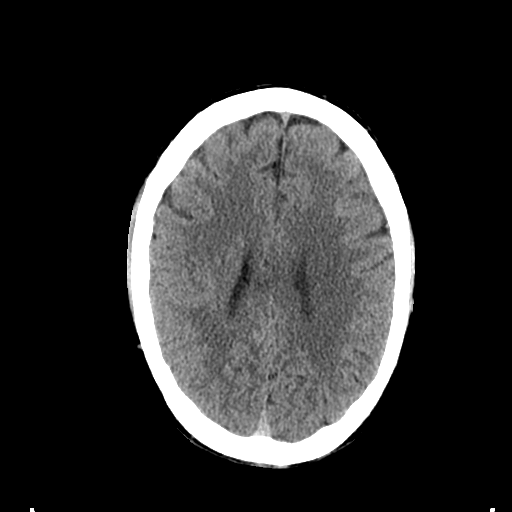
[im 25/35  brain]
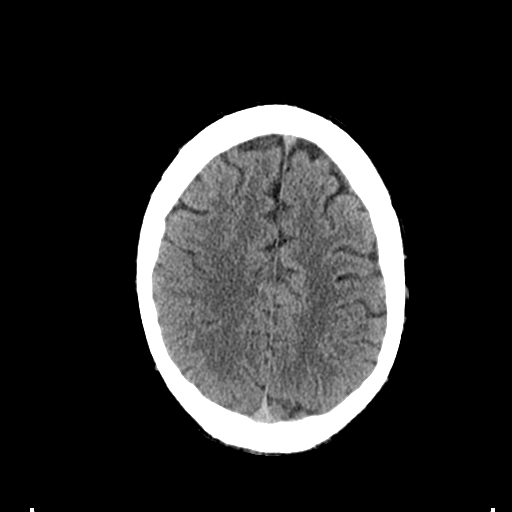
[im 29/35  brain]
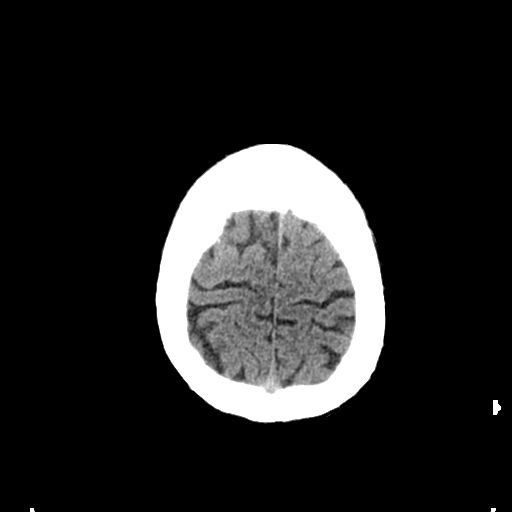
[im 32/35  brain]
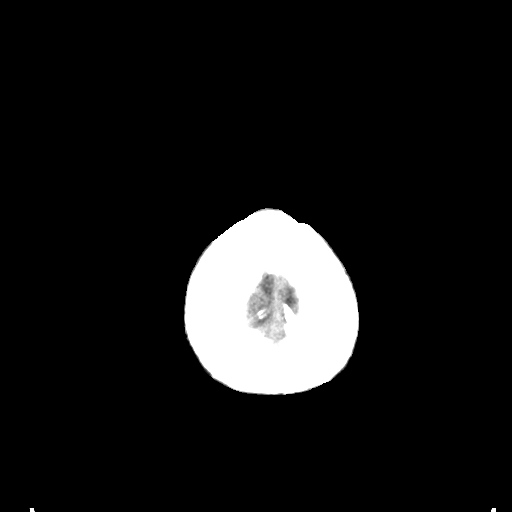
[im 32/35  bone]
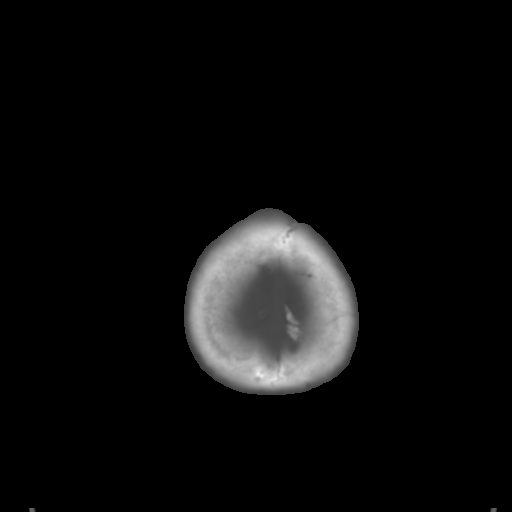

[Series 6: coronal soft tissue · coronal · 0.34mm/px · 3 of 84 slices shown]
[im 28/84  brain]
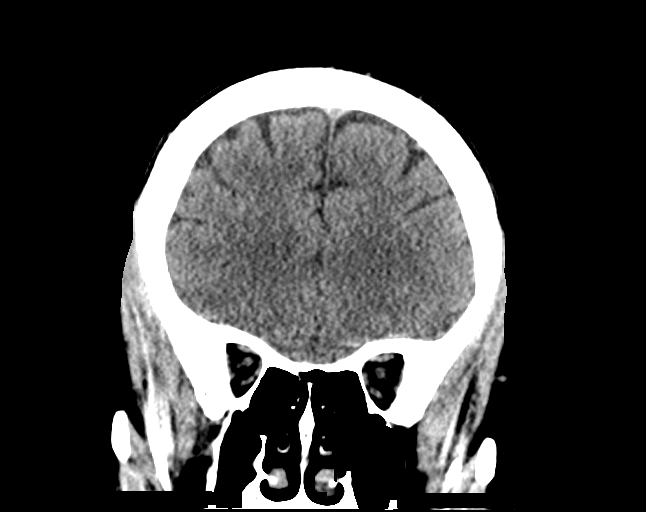
[im 37/84  brain]
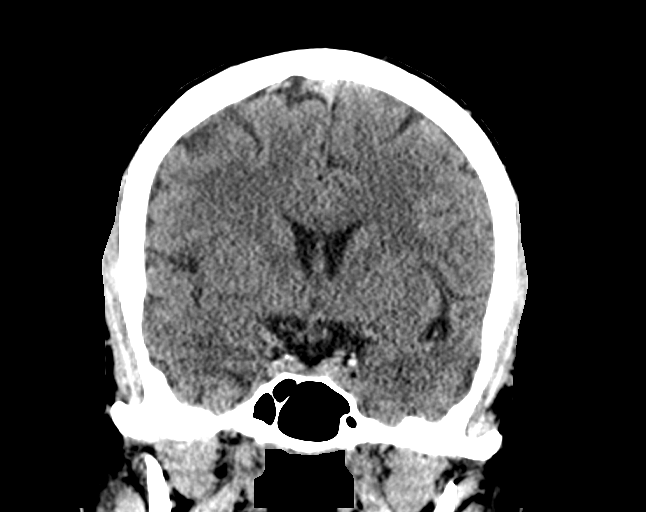
[im 47/84  brain]
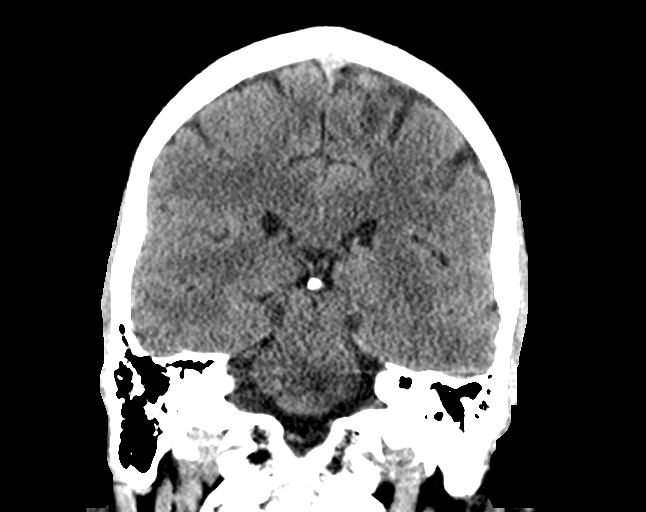

[Series 7: sagittal soft tissue · sagittal · 0.34mm/px · 3 of 84 slices shown]
[im 28/84  brain]
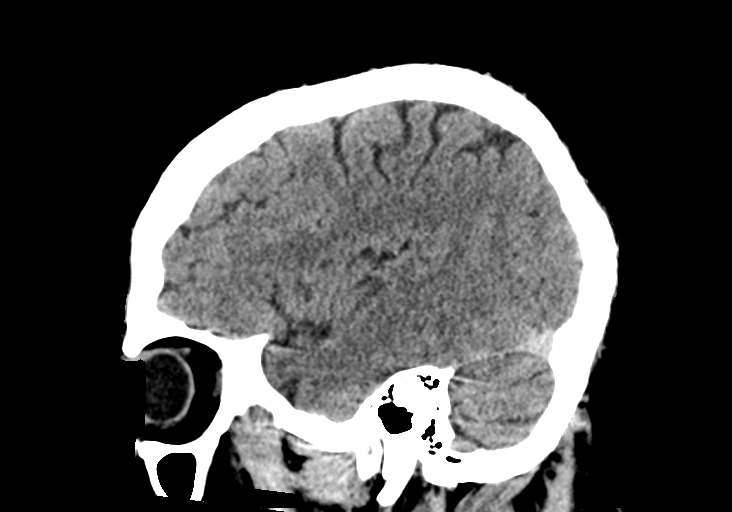
[im 42/84  brain]
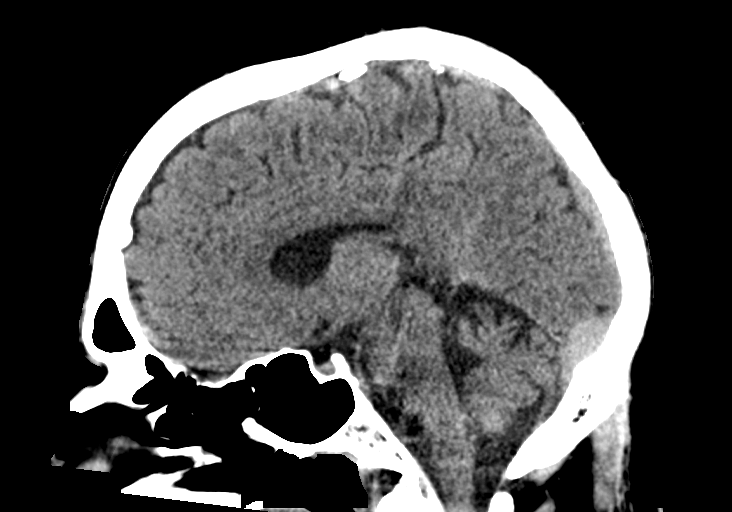
[im 56/84  brain]
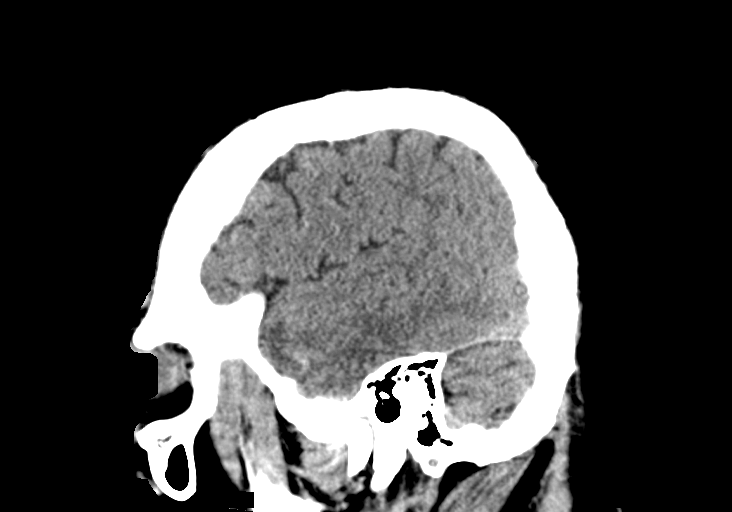

[15 of 47 positions shown; findings below may reference images not displayed]

FINDINGS: Brain: No evidence of parenchymal hemorrhage or extra-axial fluid
collection. No mass lesion, mass effect, or midline shift. No CT
evidence of acute infarction. Cerebral volume is age appropriate. No
ventriculomegaly.

Vascular: No acute abnormality.

Skull: No evidence of calvarial fracture.

Sinuses/Orbits: Mucoperiosteal thickening in the inferior right
maxillary sinus, partially visualized. No fluid levels.

Other:  The mastoid air cells are unopacified.
IMPRESSION: No evidence of acute intracranial abnormality.

Minimal chronic appearing right maxillary sinusitis.
